# Patient Record
Sex: Male | Born: 1980 | Race: White | Hispanic: No | Marital: Single | Smoking: Current every day smoker
Health system: Southern US, Community
[De-identification: ages and names within clinical notes are randomized; demographics above are authoritative.]

## PROBLEM LIST (undated history)

## (undated) DIAGNOSIS — G43909 Migraine, unspecified, not intractable, without status migrainosus: Secondary | ICD-10-CM

## (undated) DIAGNOSIS — G9389 Other specified disorders of brain: Secondary | ICD-10-CM

## (undated) HISTORY — PX: PB APPENDECTOMY: 44950

## (undated) HISTORY — PX: APPENDECTOMY: SHX54

---

## 2005-01-26 ENCOUNTER — Emergency Department: Payer: Self-pay | Admitting: Emergency Medicine

## 2007-06-02 ENCOUNTER — Emergency Department: Payer: Self-pay | Admitting: Emergency Medicine

## 2010-11-18 ENCOUNTER — Emergency Department: Payer: Self-pay | Admitting: Emergency Medicine

## 2012-04-14 ENCOUNTER — Emergency Department: Payer: Self-pay | Admitting: *Deleted

## 2012-04-14 LAB — ETHANOL: Ethanol: <3 mg/dL

## 2012-04-14 LAB — URINALYSIS, COMPLETE
Glucose,UR: NEGATIVE mg/dL (ref 0–75)
Nitrite: NEGATIVE
Protein: NEGATIVE

## 2012-04-14 LAB — DRUG SCREEN, URINE
Amphetamines, Ur Screen: NEGATIVE (ref ?–1000)
Benzodiazepine, Ur Scrn: NEGATIVE (ref ?–200)
Methadone, Ur Screen: NEGATIVE (ref ?–300)
Tricyclic, Ur Screen: NEGATIVE (ref ?–1000)

## 2012-04-14 LAB — COMPREHENSIVE METABOLIC PANEL
Anion Gap: 9 (ref 7–16)
BUN: 14 mg/dL (ref 7–18)
Calcium, Total: 8.9 mg/dL (ref 8.5–10.1)
Chloride: 103 mmol/L (ref 98–107)
EGFR (African American): >60
Glucose: 66 mg/dL (ref 65–99)
SGOT(AST): 27 U/L (ref 15–37)
SGPT (ALT): 60 U/L (ref 12–78)
Total Protein: 7.4 g/dL (ref 6.4–8.2)

## 2012-04-14 LAB — CBC
HGB: 13.3 g/dL (ref 13.0–18.0)
RBC: 4.23 10*6/uL — ABNORMAL LOW (ref 4.40–5.90)

## 2012-09-16 ENCOUNTER — Emergency Department (HOSPITAL_COMMUNITY)
Admission: EM | Admit: 2012-09-16 | Discharge: 2012-09-16 | Disposition: A | Discharge status: Home / Self Care | Payer: Self-pay | Attending: Emergency Medicine | Admitting: Emergency Medicine

## 2012-09-16 ENCOUNTER — Encounter (HOSPITAL_COMMUNITY): Payer: Self-pay | Admitting: *Deleted

## 2012-09-16 DIAGNOSIS — R42 Dizziness and giddiness: Secondary | ICD-10-CM | POA: Insufficient documentation

## 2012-09-16 DIAGNOSIS — F17200 Nicotine dependence, unspecified, uncomplicated: Secondary | ICD-10-CM | POA: Insufficient documentation

## 2012-09-16 DIAGNOSIS — R51 Headache: Secondary | ICD-10-CM | POA: Insufficient documentation

## 2012-09-16 MED ORDER — CEPHALEXIN 500 MG PO CAPS: 500.0000 mg | ORAL_CAPSULE | Freq: Four times a day (QID) | ORAL | Status: DC

## 2012-09-16 MED ORDER — KETOROLAC TROMETHAMINE 30 MG/ML IJ SOLN
30.0000 mg | Freq: Once | INTRAMUSCULAR | Status: AC
  Administered 2012-09-16: 30 mg via INTRAVENOUS
  Filled 2012-09-16: qty 1

## 2012-09-16 MED ORDER — SODIUM CHLORIDE 0.9 % IV BOLUS (SEPSIS)
1000.0000 mL | Freq: Once | INTRAVENOUS | Status: AC
  Administered 2012-09-16: 1000 mL via INTRAVENOUS

## 2012-09-16 MED ORDER — DEXAMETHASONE SODIUM PHOSPHATE 10 MG/ML IJ SOLN
10.0000 mg | Freq: Once | INTRAMUSCULAR | Status: AC
  Administered 2012-09-16: 10 mg via INTRAVENOUS
  Filled 2012-09-16: qty 1

## 2012-09-16 MED ORDER — DIPHENHYDRAMINE HCL 50 MG/ML IJ SOLN
25.0000 mg | Freq: Once | INTRAMUSCULAR | Status: AC
  Administered 2012-09-16: 25 mg via INTRAVENOUS
  Filled 2012-09-16: qty 1

## 2012-09-16 MED ORDER — FENTANYL CITRATE 0.05 MG/ML IJ SOLN
100.0000 ug | Freq: Once | INTRAMUSCULAR | Status: AC
  Administered 2012-09-16: 100 ug via INTRAVENOUS
  Filled 2012-09-16: qty 2

## 2012-09-16 MED ORDER — HYDROCODONE-ACETAMINOPHEN 5-325 MG PO TABS: 1.0000 | ORAL_TABLET | ORAL | Status: DC | PRN

## 2012-09-16 NOTE — ED Provider Notes (Signed)
Medical screening examination/treatment/procedure(s) were performed by non-physician practitioner and as supervising physician I was immediately available for consultation/collaboration.   Gwyneth Sprout, MD 09/16/12 2340

## 2012-09-16 NOTE — ED Notes (Signed)
Headache for one week woth dizziness intermittently and his head feels very heavy.  He is also c/o a knot on his head

## 2012-09-16 NOTE — ED Provider Notes (Signed)
History     CSN: [id number]  Arrival date & time 09/16/12  1536   First MD Initiated Contact with Patient 09/16/12 1905      Chief Complaint  Patient presents with  . Headache    (Consider location/radiation/quality/duration/timing/severity/associated sxs/prior treatment) HPI  Patient presents to the ED with complaints of headache for 1 week. It has come and gone and he has had intermittent dizziness with it. He describes his head feeling heavy and feels as though the back of his scalp is swollen but denies any injury, loc, vomiting, neck pain, fevers, weakness, loss of balance. Has tried OTC medications with no relief. Pt does not appear toxic and is A&O x 3.  History reviewed. No pertinent past medical history.  History reviewed. No pertinent past surgical history.  No family history on file.  History  Substance Use Topics  . Smoking status: Current Every Day Smoker  . Smokeless tobacco: Not on file  . Alcohol Use: Yes      Review of Systems  Neurological: Positive for dizziness and headaches.  All other systems reviewed and are negative.    Allergies  Review of patient's allergies indicates no known allergies.  Home Medications   Current Outpatient Rx  Name  Route  Sig  Dispense  Refill  . IBUPROFEN 200 MG PO TABS   Oral   Take 800 mg by mouth every 6 (six) hours as needed. For pain.           BP 125/97  Pulse 64  Temp 98.3 F (36.8 C) (Oral)  Resp 16  SpO2 100%  Physical Exam  Nursing note and vitals reviewed. Constitutional: He appears well-developed and well-nourished. No distress.  HENT:  Head: Normocephalic and atraumatic.  Eyes: Pupils are equal, round, and reactive to light.  Neck: Normal range of motion. Neck supple. No spinous process tenderness and no muscular tenderness present.  Cardiovascular: Normal rate and regular rhythm.   Pulmonary/Chest: Effort normal.  Abdominal: Soft.  Lymphadenopathy:       Head (right side): Occipital  (lymph node is swollen but without any signs of infection. It is mobile and non tender) adenopathy present.  Neurological: He is alert. He has normal strength. No cranial nerve deficit or sensory deficit. He displays a negative Romberg sign.  Skin: Skin is warm and dry.    ED Course  Procedures (including critical care time)  Labs Reviewed - No data to display No results found.   No diagnosis found.  Dx. headache  MDM  Decadron, Toradol and Benadryl given- helped the headache a little bit. Fentanyl given and patients headache resolved.   No concerning findings with headache. The patient at discharge asks for referral for Hep C that he thinks someone told him that he has. Will refer to GI for follow-up with this. Pt advised to bring paper work confirming Hep C to appointment.  Discussed if CT head was needed with patient. We have decided against it. Pt understands symptoms that warrant return back to ED.  Pt has been advised of the symptoms that warrant their return to the ED. Patient has voiced understanding and has agreed to follow-up with the PCP or specialist.        Dorthula Matas, PA 09/16/12 2136

## 2012-09-17 ENCOUNTER — Emergency Department (HOSPITAL_COMMUNITY): Payer: Self-pay

## 2012-09-17 ENCOUNTER — Encounter (HOSPITAL_COMMUNITY): Payer: Self-pay

## 2012-09-17 ENCOUNTER — Emergency Department (HOSPITAL_COMMUNITY)
Admission: EM | Admit: 2012-09-17 | Discharge: 2012-09-17 | Disposition: A | Discharge status: Home / Self Care | Payer: Self-pay | Attending: Emergency Medicine | Admitting: Emergency Medicine

## 2012-09-17 DIAGNOSIS — F17200 Nicotine dependence, unspecified, uncomplicated: Secondary | ICD-10-CM | POA: Insufficient documentation

## 2012-09-17 DIAGNOSIS — H539 Unspecified visual disturbance: Secondary | ICD-10-CM | POA: Insufficient documentation

## 2012-09-17 DIAGNOSIS — D1779 Benign lipomatous neoplasm of other sites: Secondary | ICD-10-CM | POA: Insufficient documentation

## 2012-09-17 DIAGNOSIS — R42 Dizziness and giddiness: Secondary | ICD-10-CM | POA: Insufficient documentation

## 2012-09-17 DIAGNOSIS — R51 Headache: Secondary | ICD-10-CM | POA: Insufficient documentation

## 2012-09-17 DIAGNOSIS — R209 Unspecified disturbances of skin sensation: Secondary | ICD-10-CM | POA: Insufficient documentation

## 2012-09-17 MED ORDER — OXYCODONE-ACETAMINOPHEN 5-325 MG PO TABS: 1.0000 | ORAL_TABLET | Freq: Four times a day (QID) | ORAL | Status: DC | PRN

## 2012-09-17 MED ORDER — KETOROLAC TROMETHAMINE 60 MG/2ML IM SOLN: 60.0000 mg | Freq: Once | INTRAMUSCULAR | Status: DC

## 2012-09-17 MED ORDER — OXYCODONE-ACETAMINOPHEN 5-325 MG PO TABS
2.0000 | ORAL_TABLET | Freq: Once | ORAL | Status: AC
  Administered 2012-09-17: 2 via ORAL
  Filled 2012-09-17: qty 2

## 2012-09-17 NOTE — ED Provider Notes (Signed)
History   This chart was scribed for non-physician practitioner working with Benjamin Razor, MD by Smitty Pluck. This patient was seen in room TR11C/TR11C and the patient's care was started at 3:44 PM.   CSN: [id number]  Arrival date & time 09/17/12  1353   None     Chief Complaint  Patient presents with  . Headache    (Consider location/radiation/quality/duration/timing/severity/associated sxs/prior treatment) Patient is a 32 y.o. male presenting with headaches. The history is provided by the patient and the spouse. No language interpreter was used.  Headache  This is a new problem. The current episode started more than 2 days ago. The problem occurs constantly. The problem has not changed since onset.The headache is associated with nothing. The pain is moderate. The pain does not radiate. Pertinent negatives include no fever, no shortness of breath, no nausea and no vomiting.   Benjamin Lowery is a 32 y.o. male who presents to the Emergency Department complaining of constant, moderate headache onset 5 days ago. He mentions that he had dizziness and blurred vision today. Pt states today he had syncope. He states that he landed on his bed and had LOC for 15-20 seconds. The syncope was witnessed by his girlfriend. Pt was seen in ED for same symptoms 1 day ago and states he was told that he had adenopathy. He has taken hydrocodone without relief. He reports numbness in bilateral hands. He denies nausea, vomiting, weakness and any other pain.   History reviewed. No pertinent past medical history.  History reviewed. No pertinent past surgical history.  No family history on file.  History  Substance Use Topics  . Smoking status: Current Every Day Smoker  . Smokeless tobacco: Not on file  . Alcohol Use: Yes      Review of Systems  Constitutional: Negative for fever and chills.  Eyes: Positive for visual disturbance.  Respiratory: Negative for shortness of breath.     Gastrointestinal: Negative for nausea and vomiting.  Neurological: Positive for dizziness, numbness and headaches. Negative for weakness.  All other systems reviewed and are negative.    Allergies  Review of patient's allergies indicates no known allergies.  Home Medications   Current Outpatient Rx  Name  Route  Sig  Dispense  Refill  . CEPHALEXIN 500 MG PO CAPS   Oral   Take 1 capsule (500 mg total) by mouth 4 (four) times daily.   20 capsule   0   . HYDROCODONE-ACETAMINOPHEN 5-325 MG PO TABS   Oral   Take 1 tablet by mouth every 4 (four) hours as needed for pain.   6 tablet   0   . IBUPROFEN 200 MG PO TABS   Oral   Take 800 mg by mouth every 6 (six) hours as needed. For pain.           BP 133/96  Pulse 94  Temp 98.1 F (36.7 C) (Oral)  Resp 18  Ht 5\' 1"  (1.549 m)  Wt 130 lb (58.968 kg)  BMI 24.56 kg/m2  SpO2 97%  Physical Exam  Nursing note and vitals reviewed. Constitutional: He is oriented to person, place, and time. He appears well-developed and well-nourished. No distress.  HENT:  Head: Normocephalic and atraumatic.  Eyes: Conjunctivae normal and EOM are normal. Pupils are equal, round, and reactive to light.       No nystagmus  No photophobia    Neck: Neck supple. No tracheal deviation present.  Cardiovascular: Normal rate, regular rhythm and normal  heart sounds.   Pulmonary/Chest: Effort normal and breath sounds normal. No respiratory distress.  Musculoskeletal: Normal range of motion.  Neurological: He is alert and oriented to person, place, and time. No cranial nerve deficit.  Skin: Skin is warm and dry.  Psychiatric: He has a normal mood and affect. His behavior is normal.    ED Course  Procedures (including critical care time) DIAGNOSTIC STUDIES: Oxygen Saturation is 97% on room air, adequate by my interpretation.    COORDINATION OF CARE: 3:47 PM Discussed ED treatment with pt and pt agrees.     Labs Reviewed - No data to display Ct  Head Wo Contrast  09/17/2012  *RADIOLOGY REPORT*  Clinical Data: 32 year old male with headache, blurred vision, dizziness, syncopal episode.  CT HEAD WITHOUT CONTRAST  Technique:  Contiguous axial images were obtained from the base of the skull through the vertex without contrast.  Comparison: None.  Findings: Visualized paranasal sinuses and mastoids are clear. Visualized orbit soft tissues are within normal limits.  Visualized scalp soft tissues are within normal limits.  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  No other abnormal no other fat density identified within the brain.  No associated mass effect.  No midline shift or evidence of any other intracranial mass lesion. No ventriculomegaly. Normal cerebral volume.  Gray-white matter differentiation is within normal limits throughout the brain.  No evidence of cortically based acute infarction identified.  No acute intracranial hemorrhage identified.  No suspicious intracranial vascular hyperdensity.  IMPRESSION: Negative noncontrast CT appearance of the brain.  Incidental small congenital intracranial lipoma.   Original Report Authenticated By: Erskine Speed, M.D.      No diagnosis found.  Headache.   Radiology results reviewed, discussed with Dr. Juleen China, shared with patient.  Patient provided prescription for analgesic and referred to neurosurgery for follow-up.  MDM        I personally performed the services described in this documentation, which was scribed in my presence. The recorded information has been reviewed and is accurate.    Jimmye Norman, NP 09/17/12 [phone]

## 2012-09-17 NOTE — ED Notes (Addendum)
Pt. Was here yesterday for a headache and he is not feeling any better.  Pain medication is not helping, Blurred vision, dizziness . Pt. Also reports getting out of bed last night and passing out onto the bed for approximately 15 - 20 seconds.  Witnessed by girlfriend.

## 2012-09-17 NOTE — ED Notes (Signed)
Pt discharged.Vital signs stable and GCS 15 

## 2012-09-20 NOTE — ED Provider Notes (Signed)
Medical screening examination/treatment/procedure(s) were performed by non-physician practitioner and as supervising physician I was immediately available for consultation/collaboration.  Jase Himmelberger, MD 09/20/12 0741 

## 2012-10-03 ENCOUNTER — Ambulatory Visit: Payer: Self-pay | Admitting: Neurology

## 2014-05-29 ENCOUNTER — Emergency Department: Payer: Self-pay | Admitting: Emergency Medicine

## 2014-07-22 ENCOUNTER — Emergency Department: Payer: Self-pay | Admitting: Emergency Medicine

## 2016-06-03 ENCOUNTER — Encounter: Payer: Self-pay | Admitting: Emergency Medicine

## 2016-06-03 ENCOUNTER — Emergency Department
Admission: EM | Admit: 2016-06-03 | Discharge: 2016-06-03 | Disposition: A | Discharge status: Home / Self Care | Payer: Self-pay | Attending: Emergency Medicine | Admitting: Emergency Medicine

## 2016-06-03 ENCOUNTER — Emergency Department: Payer: Self-pay

## 2016-06-03 DIAGNOSIS — R519 Headache, unspecified: Secondary | ICD-10-CM

## 2016-06-03 DIAGNOSIS — Z79899 Other long term (current) drug therapy: Secondary | ICD-10-CM | POA: Insufficient documentation

## 2016-06-03 DIAGNOSIS — F17210 Nicotine dependence, cigarettes, uncomplicated: Secondary | ICD-10-CM | POA: Insufficient documentation

## 2016-06-03 DIAGNOSIS — R51 Headache: Secondary | ICD-10-CM | POA: Insufficient documentation

## 2016-06-03 HISTORY — DX: Migraine, unspecified, not intractable, without status migrainosus: G43.909

## 2016-06-03 HISTORY — DX: Other specified disorders of brain: G93.89

## 2016-06-03 LAB — CBC
HCT: 39.5 % — ABNORMAL LOW (ref 40.0–52.0)
Hemoglobin: 13.6 g/dL (ref 13.0–18.0)
MCH: 31.1 pg (ref 26.0–34.0)
MCHC: 34.4 g/dL (ref 32.0–36.0)
MCV: 90.5 fL (ref 80.0–100.0)
Platelets: 240 10*3/uL (ref 150–440)
RBC: 4.36 MIL/uL — ABNORMAL LOW (ref 4.40–5.90)
RDW: 12.7 % (ref 11.5–14.5)
WBC: 11.5 10*3/uL — ABNORMAL HIGH (ref 3.8–10.6)

## 2016-06-03 LAB — BASIC METABOLIC PANEL
Anion gap: 6 (ref 5–15)
BUN: 8 mg/dL (ref 6–20)
CO2: 29 mmol/L (ref 22–32)
CREATININE: 0.94 mg/dL (ref 0.61–1.24)
Calcium: 8.9 mg/dL (ref 8.9–10.3)
Chloride: 103 mmol/L (ref 101–111)
Glucose, Bld: 101 mg/dL — ABNORMAL HIGH (ref 65–99)
Potassium: 3.8 mmol/L (ref 3.5–5.1)
SODIUM: 138 mmol/L (ref 135–145)

## 2016-06-03 MED ORDER — PROCHLORPERAZINE EDISYLATE 5 MG/ML IJ SOLN
10.0000 mg | Freq: Once | INTRAMUSCULAR | Status: DC
  Filled 2016-06-03: qty 2

## 2016-06-03 NOTE — ED Provider Notes (Signed)
[facility] Emergency Department Provider Note    ____________________________________________   I have reviewed the triage vital signs and the nursing notes.   HISTORY  Chief Complaint Migraine and Loss of Consciousness   History limited by: Not Limited   HPI Benjamin Lowery is a 35 y.o. male who presents to the emergency department today because of concerns for headache and a syncopal episode. Patient states that he has had a headache for roughly the past week. He has tried taking some Tylenol which gives him intermittent relief. He states he has a history of headaches. He also states that he has a history of an intracranial mass. He has not seen anybody for this or his headaches and a long time. The patient states that he has not had any occult he with vision or hearing. No nausea or vomiting. Patient states today with an he was with his girlfriend he apparently passed out. No chest pain shortness breath or palpitations.   Past Medical History:  Diagnosis Date  . Brain mass   . Migraine     There are no active problems to display for this patient.   Past Surgical History:  Procedure Laterality Date  . APPENDECTOMY      Prior to Admission medications   Medication Sig Start Date End Date Taking? Authorizing Provider  acetaminophen (TYLENOL) 500 MG tablet Take 1,000 mg by mouth every 6 (six) hours as needed for headache.   Yes Historical Provider, MD    Allergies Review of patient's allergies indicates no known allergies.  History reviewed. No pertinent family history.  Social History Social History  Substance Use Topics  . Smoking status: Current Every Day Smoker    Packs/day: 1.00    Years: 15.00    Types: Cigarettes  . Smokeless tobacco: Never Used  . Alcohol use Yes    Review of Systems  Constitutional: Negative for fever. Cardiovascular: Negative for chest pain. Respiratory: Negative for shortness of  breath. Gastrointestinal: Negative for abdominal pain, vomiting and diarrhea. Neurological: Positive for headaches  10-point ROS otherwise negative.  ____________________________________________   PHYSICAL EXAM:  VITAL SIGNS: ED Triage Vitals  Enc Vitals Group     BP 06/03/16 1927 (!) 140/96     Pulse Rate 06/03/16 1927 94     Resp 06/03/16 1927 15     Temp 06/03/16 1927 99.1 F (37.3 C)     Temp Source 06/03/16 1927 Oral     SpO2 06/03/16 1927 95 %     Weight 06/03/16 1928 140 lb (63.5 kg)     Height 06/03/16 1928 5\' 1"  (1.549 m)     Head Circumference --      Peak Flow --      Pain Score 06/03/16 1928 7   Constitutional: Alert and oriented. Well appearing and in no distress. Eyes: Conjunctivae are normal. Normal extraocular movements. ENT   Head: Normocephalic and atraumatic.   Nose: No congestion/rhinnorhea.   Mouth/Throat: Mucous membranes are moist.   Neck: No stridor. Hematological/Lymphatic/Immunilogical: No cervical lymphadenopathy. Cardiovascular: Normal rate, regular rhythm.  No murmurs, rubs, or gallops. Respiratory: Normal respiratory effort without tachypnea nor retractions. Breath sounds are clear and equal bilaterally. No wheezes/rales/rhonchi. Gastrointestinal: Soft and nontender. No distention.  Genitourinary: Deferred Musculoskeletal: Normal range of motion in all extremities. No lower extremity edema. Neurologic:  Normal speech and language. No gross focal neurologic deficits are appreciated.  Skin:  Skin is warm, dry and intact. No rash noted. Psychiatric: Mood and affect  are normal. Speech and behavior are normal. Patient exhibits appropriate insight and judgment.  ____________________________________________    LABS (pertinent positives/negatives)  Labs Reviewed  BASIC METABOLIC PANEL - Abnormal; Notable for the following:       Result Value   Glucose, Bld 101 (*)    All other components within normal limits  CBC - Abnormal;  Notable for the following:    WBC 11.5 (*)    RBC 4.36 (*)    HCT 39.5 (*)    All other components within normal limits  URINALYSIS COMPLETEWITH MICROSCOPIC ([facility] ONLY)  CBG MONITORING, ED     ____________________________________________   EKG  I, Nance Pear, attending physician, personally viewed and interpreted this EKG  EKG Time: 1944 Rate: 86 Rhythm: normal sinus rhythm Axis: normal Intervals: qtc 423 QRS: narrow ST changes: no st elevation Impression: normal ekg  ____________________________________________    RADIOLOGY  CT head   IMPRESSION:  No acute intracranial abnormality.    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 No evidence of  hydrocephalus or other compressive sequela.    ____________________________________________   PROCEDURES  Procedures  ____________________________________________   INITIAL IMPRESSION / ASSESSMENT AND PLAN / ED COURSE  Pertinent labs & imaging results that were available during my care of the patient were reviewed by me and considered in my medical decision making (see chart for details).  Patient here with headaches and syncopal episode. Patient with history of migraines. Did initially write for Compazine which patient refused. Then discussed with patient Fioricet which she also declined. He also declined steroids. He stated that he would discuss home and take Tylenol. He without any change in his lipoma. I discussed this with the patient. I also discussed with him that I would like him to follow up with neurology for further management of both the lipoma as well as headaches. Patient verbalizes understanding. ____________________________________________   FINAL CLINICAL IMPRESSION(S) / ED DIAGNOSES  Final diagnoses:  Bad headache     Note: This dictation was prepared with Dragon dictation. Any transcriptional errors that result from this process are unintentional    Nance Pear,  MD 06/03/16 2102

## 2016-06-03 NOTE — Discharge Instructions (Signed)
Please seek medical attention for any high fevers, chest pain, shortness of breath, change in behavior, persistent vomiting, bloody stool or any other new or concerning symptoms.  

## 2016-06-03 NOTE — ED Notes (Signed)
Pt refuses compazine. Pt states "i don't want any medicine that might make me sleepy."

## 2016-06-03 NOTE — ED Triage Notes (Signed)
Pt reports migraines for the last week. Stated that he had LOC today. No head injury. Hx of inoperable brain mass. Reports blurred vision.

## 2016-06-03 NOTE — ED Notes (Signed)
FIRST NURSE NOTE: Patient presents to the ED via EMS from home. Patient with complaints of a headache x 12 hours. Patient reports that he experienced a syncopal episode earlier today. Of note, PMH significant for a brain tumor.

## 2016-06-03 NOTE — ED Notes (Signed)
Patient proceeds from ED registration desk outside to smoke despite being asked to remain inside. Ambulatory without difficulties at this time.

## 2016-06-26 ENCOUNTER — Emergency Department
Admission: EM | Admit: 2016-06-26 | Discharge: 2016-06-26 | Discharge status: Against Medical Advice | Payer: Self-pay | Attending: Emergency Medicine | Admitting: Emergency Medicine

## 2016-06-26 ENCOUNTER — Emergency Department: Payer: Self-pay

## 2016-06-26 DIAGNOSIS — T401X1A Poisoning by heroin, accidental (unintentional), initial encounter: Secondary | ICD-10-CM | POA: Insufficient documentation

## 2016-06-26 DIAGNOSIS — F17210 Nicotine dependence, cigarettes, uncomplicated: Secondary | ICD-10-CM | POA: Insufficient documentation

## 2016-06-26 DIAGNOSIS — Z79899 Other long term (current) drug therapy: Secondary | ICD-10-CM | POA: Insufficient documentation

## 2016-06-26 DIAGNOSIS — F1410 Cocaine abuse, uncomplicated: Secondary | ICD-10-CM

## 2016-06-26 DIAGNOSIS — F1419 Cocaine abuse with unspecified cocaine-induced disorder: Secondary | ICD-10-CM | POA: Insufficient documentation

## 2016-06-26 LAB — COMPREHENSIVE METABOLIC PANEL
ALBUMIN: 3.7 g/dL (ref 3.5–5.0)
ALK PHOS: 40 U/L (ref 38–126)
ALT: 28 U/L (ref 17–63)
ANION GAP: 9 (ref 5–15)
AST: 29 U/L (ref 15–41)
BILIRUBIN TOTAL: 0.3 mg/dL (ref 0.3–1.2)
BUN: 8 mg/dL (ref 6–20)
CALCIUM: 8.5 mg/dL — AB (ref 8.9–10.3)
CO2: 27 mmol/L (ref 22–32)
CREATININE: 0.85 mg/dL (ref 0.61–1.24)
Chloride: 107 mmol/L (ref 101–111)
GFR calc Af Amer: >60 mL/min (ref >60)
GFR calc non Af Amer: >60 mL/min (ref >60)
GLUCOSE: 114 mg/dL — AB (ref 65–99)
Potassium: 3.9 mmol/L (ref 3.5–5.1)
SODIUM: 143 mmol/L (ref 135–145)
TOTAL PROTEIN: 6.4 g/dL — AB (ref 6.5–8.1)

## 2016-06-26 LAB — CBC
HEMATOCRIT: 42 % (ref 40.0–52.0)
HEMOGLOBIN: 14.5 g/dL (ref 13.0–18.0)
MCH: 31.6 pg (ref 26.0–34.0)
MCHC: 34.6 g/dL (ref 32.0–36.0)
MCV: 91.4 fL (ref 80.0–100.0)
Platelets: 232 10*3/uL (ref 150–440)
RBC: 4.59 MIL/uL (ref 4.40–5.90)
RDW: 13.6 % (ref 11.5–14.5)
WBC: 10.6 10*3/uL (ref 3.8–10.6)

## 2016-06-26 LAB — ETHANOL: Alcohol, Ethyl (B): 56 mg/dL — ABNORMAL HIGH (ref <5)

## 2016-06-26 LAB — URINE DRUG SCREEN, QUALITATIVE (ARMC ONLY)
Amphetamines, Ur Screen: NOT DETECTED
BARBITURATES, UR SCREEN: NOT DETECTED
BENZODIAZEPINE, UR SCRN: NOT DETECTED
COCAINE METABOLITE, UR ~~LOC~~: POSITIVE — AB
Cannabinoid 50 Ng, Ur ~~LOC~~: POSITIVE — AB
MDMA (Ecstasy)Ur Screen: NOT DETECTED
METHADONE SCREEN, URINE: NOT DETECTED
OPIATE, UR SCREEN: NOT DETECTED
PHENCYCLIDINE (PCP) UR S: NOT DETECTED
Tricyclic, Ur Screen: NOT DETECTED

## 2016-06-26 LAB — SALICYLATE LEVEL: Salicylate Lvl: <7 mg/dL (ref 2.8–30.0)

## 2016-06-26 LAB — TROPONIN I: Troponin I: <0.03 ng/mL (ref <0.03)

## 2016-06-26 LAB — ACETAMINOPHEN LEVEL

## 2016-06-26 MED ORDER — SODIUM CHLORIDE 0.9 % IV BOLUS (SEPSIS)
1000.0000 mL | Freq: Once | INTRAVENOUS | Status: AC
  Administered 2016-06-26: 1000 mL via INTRAVENOUS

## 2016-06-26 NOTE — ED Notes (Signed)
Pt irritable, agitated, stating he wants his D/C papers. Informed again that Dr. Beather Arbour wants to watch him one more hour. Pt stated he has to go home and sleep before going to work. Informed he could nap here. Pt still wants to leave and wants IV out now.   Dr. Beather Arbour notified. Pt leaving AMA.

## 2016-06-26 NOTE — ED Provider Notes (Signed)
[facility] Emergency Department Provider Note   ____________________________________________   First MD Initiated Contact with Patient 06/26/16 [phone]     (approximate)  I have reviewed the triage vital signs and the nursing notes.   HISTORY  Chief Complaint Drug Overdose    HPI Benjamin Lowery is a 35 y.o. male brought to the ED from home via EMS with the chief complaint of unresponsive secondary to heroin overdose. Patient admits to heroin use tonight. States he got out of his car and does not recall further events. Girlfriend found him unresponsive in the yard. Patient was given 2 mg of intranasal Narcan per EMS with good effect. Girlfriend thinks patient had only been unresponsive for approximately 10 minutes before she found him. He was initially hypoxic per EMS, requiring oxygen which was weaned by the time he arrived to the emergency department. Denies recent fever, chills, chest pain, shortness of breath, abdominal pain, nausea, vomiting, diarrhea. Denies recent travel or trauma. Nothing made his symptoms worse. Narcan reversed his symptoms.Denies SI/HI/AH/VH.   Past Medical History:  Diagnosis Date  . Brain mass   . Migraine     There are no active problems to display for this patient.   Past Surgical History:  Procedure Laterality Date  . APPENDECTOMY      Prior to Admission medications   Medication Sig Start Date End Date Taking? Authorizing Provider  acetaminophen (TYLENOL) 500 MG tablet Take 1,000 mg by mouth every 6 (six) hours as needed for headache.   Yes Historical Provider, MD    Allergies Review of patient's allergies indicates no known allergies.  History reviewed. No pertinent family history.  Social History Social History  Substance Use Topics  . Smoking status: Current Every Day Smoker    Packs/day: 1.00    Years: 15.00    Types: Cigarettes  . Smokeless tobacco: Never Used  . Alcohol use Yes    Review of  Systems  Constitutional: Positive for unresponsiveness. No fever/chills. Eyes: No visual changes. ENT: No sore throat. Cardiovascular: Denies chest pain. Respiratory: Denies shortness of breath. Gastrointestinal: No abdominal pain.  No nausea, no vomiting.  No diarrhea.  No constipation. Genitourinary: Negative for dysuria. Musculoskeletal: Negative for back pain. Skin: Negative for rash. Neurological: Negative for headaches, focal weakness or numbness.  10-point ROS otherwise negative.  ____________________________________________   PHYSICAL EXAM:  VITAL SIGNS: ED Triage Vitals [06/26/16 0040]  Enc Vitals Group     BP (!) 148/98     Pulse Rate 97     Resp 19     Temp 98 F (36.7 C)     Temp Source Oral     SpO2 97 %     Weight      Height      Head Circumference      Peak Flow      Pain Score 8     Pain Loc      Pain Edu?      Excl. in [place]?      Constitutional: Alert and oriented. Well appearing and in no acute distress. Eyes: Conjunctivae are normal. PERRL. EOMI. Head: Atraumatic. Nose: No congestion/rhinnorhea. Mouth/Throat: Mucous membranes are moist.  Oropharynx non-erythematous. Neck: No stridor.  No cervical spine tenderness to palpation. Cardiovascular: Normal rate, regular rhythm. Grossly normal heart sounds.  Good peripheral circulation. Respiratory: Normal respiratory effort.  No retractions. Lungs CTAB. Gastrointestinal: Soft and nontender. No distention. No abdominal bruits. No CVA tenderness. Musculoskeletal: No lower extremity tenderness  nor edema.  No joint effusions. Neurologic:  Normal speech and language. No gross focal neurologic deficits are appreciated. No gait instability. Ambulates to commode with steady gait. Skin:  Skin is warm, dry and intact. No rash noted. Psychiatric: Mood and affect are normal. Speech and behavior are normal.  ____________________________________________   LABS (all labs ordered are listed, but only abnormal  results are displayed)  Labs Reviewed  COMPREHENSIVE METABOLIC PANEL - Abnormal; Notable for the following:       Result Value   Glucose, Bld 114 (*)    Calcium 8.5 (*)    Total Protein 6.4 (*)    All other components within normal limits  ETHANOL - Abnormal; Notable for the following:    Alcohol, Ethyl (B) 56 (*)    All other components within normal limits  ACETAMINOPHEN LEVEL - Abnormal; Notable for the following:    Acetaminophen (Tylenol), Serum <10 (*)    All other components within normal limits  URINE DRUG SCREEN, QUALITATIVE ([facility] ONLY) - Abnormal; Notable for the following:    Cocaine Metabolite,Ur [place] POSITIVE (*)    Cannabinoid 50 Ng, Ur [place] POSITIVE (*)    All other components within normal limits  SALICYLATE LEVEL  CBC  TROPONIN I   ____________________________________________  EKG  ED ECG REPORT I, Fedor Kazmierski J, the attending physician, personally viewed and interpreted this ECG.   Date: 06/26/2016  EKG Time: 0033  Rate: 94  Rhythm: normal EKG, normal sinus rhythm  Axis: Normal  Intervals:none  ST&T Change: Nonspecific  ____________________________________________  RADIOLOGY  Portable chest x-ray (viewed by me, interpreted per Dr. Radene Knee): No acute cardiopulmonary process seen. ____________________________________________   PROCEDURES  Procedure(s) performed: None  Procedures  Critical Care performed: No  ____________________________________________   INITIAL IMPRESSION / ASSESSMENT AND PLAN / ED COURSE  Pertinent labs & imaging results that were available during my care of the patient were reviewed by me and considered in my medical decision making (see chart for details).  35 year old male status post heroin overdose who is now awake, alert and in no acute distress. Will obtain screening lab work, chest x-ray to evaluate for aspiration, and observe in the ED for several hours.  Clinical Course  Comment By Time  Patient sipping water in  no acute distress. Eager for discharge. Explained he requires a longer period of observation. Paulette Blanch, MD 10/30 [phone]  Patient and girlfriend left AMA while I was seeing other patients. They did not wish to wait for the 3 hour period of observation. He was noted to ambulate down the hall with steady gait. Paulette Blanch, MD 10/30 0229     ____________________________________________   FINAL CLINICAL IMPRESSION(S) / ED DIAGNOSES  Final diagnoses:  Accidental overdose of heroin, initial encounter  Cocaine abuse      NEW MEDICATIONS STARTED DURING THIS VISIT:  New Prescriptions   No medications on file     Note:  This document was prepared using Dragon voice recognition software and may include unintentional dictation errors.    Paulette Blanch, MD 06/26/16 [phone]

## 2016-06-26 NOTE — ED Notes (Signed)
Pt refusing to stay, wants IV out.  EDP notified.  Okay'd to sign out AMA.

## 2016-06-26 NOTE — ED Notes (Signed)
X-ray at bedside

## 2016-06-26 NOTE — ED Triage Notes (Signed)
Pt found unresponsive in front yard by girlfriend. BIB [facility]. Gave 2 intranasal narcan. Pt reports doing heroin tonight. Pt states he doesn't know if heroin came from a different dealer or if he did too much. Pt was 70's on RA, started on 4L  and oxygen came to 100%. Pt has already been weaned off oxygen. Also admits to ETOH tonight. Pt is alert and oriented upon arrival, ambulatory to toilet with steady gait to obtain urine sample.

## 2016-06-26 NOTE — ED Notes (Signed)
Pt stated he needed to urinate. Pt also stated he wanted to leave, that he had been here too long. Pt advised that Dr. Beather Arbour wants to watch pt in case narcan wears off. Pt girlfriend at bedside.

## 2018-04-05 IMAGING — CT CT HEAD W/O CM
3 series · 14 of 43 positions shown, 16 images · non-contrast
Comparison: MRI of the brain 10/03/2012

CLINICAL DATA: Headache with loss of consciousness and blurry
vision. Patient was diagnosed with brain tumor in 0081 which has not
been treated.

EXAM:
CT HEAD WITHOUT CONTRAST
TECHNIQUE: Contiguous axial images were obtained from the base of the skull
through the vertex without intravenous contrast.

[Series 2: head wo · axial · 0.47mm/px · z∈[-166,-61]mm · 8 of 26 slices shown, 10 images]
[im 3/26  brain]
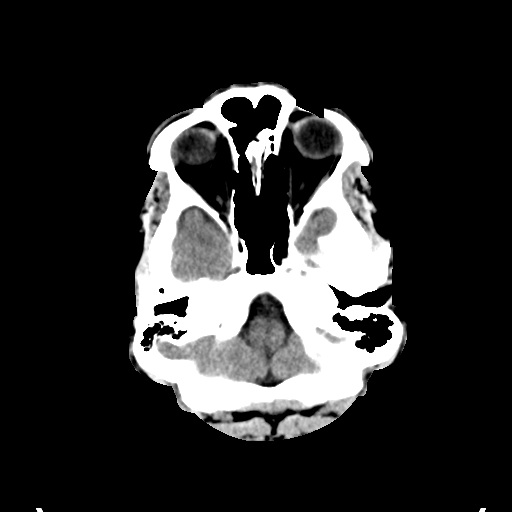
[im 3/26  bone]
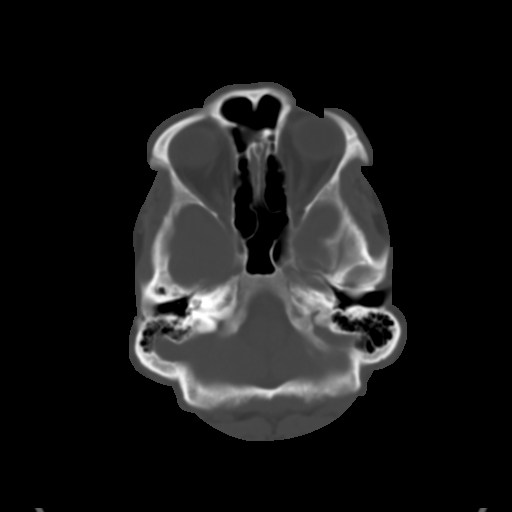
[im 6/26  brain]
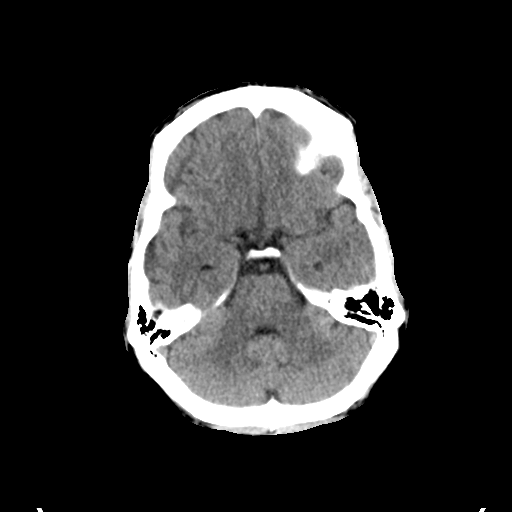
[im 9/26  brain]
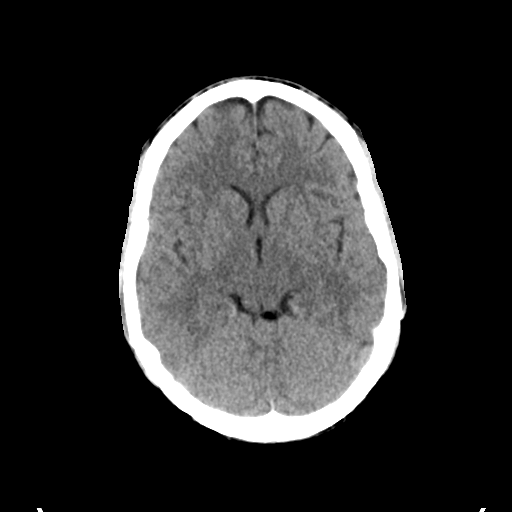
[im 12/26  brain]
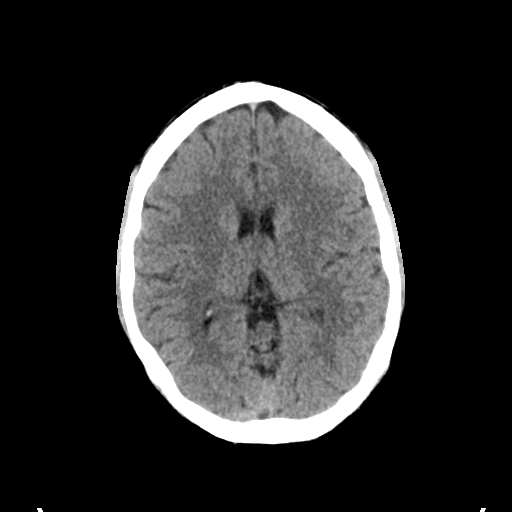
[im 15/26  brain]
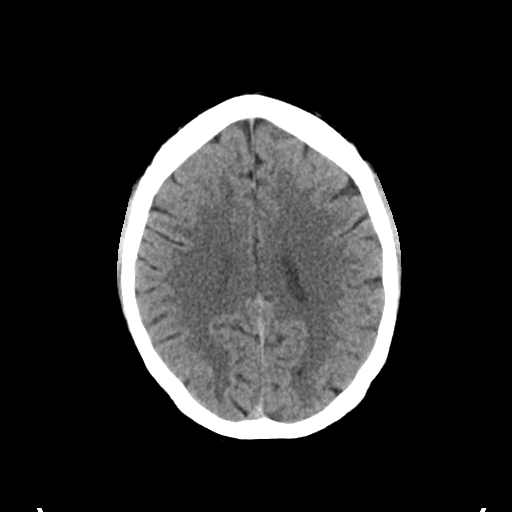
[im 15/26  bone]
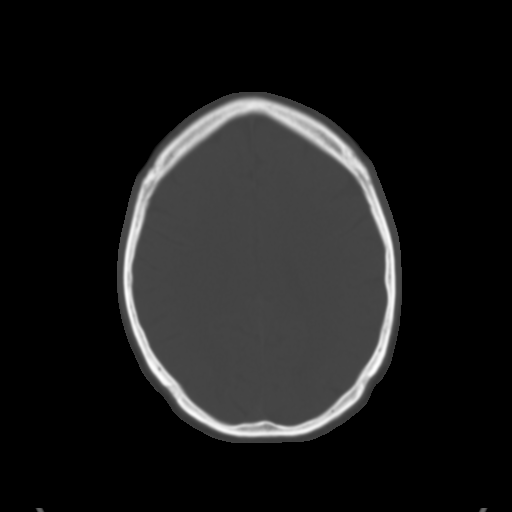
[im 18/26  brain]
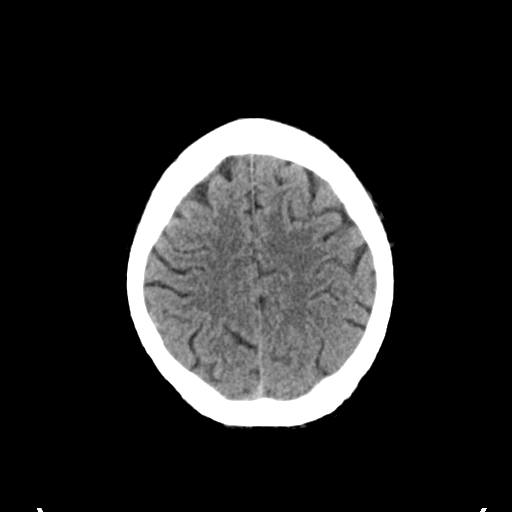
[im 21/26  brain]
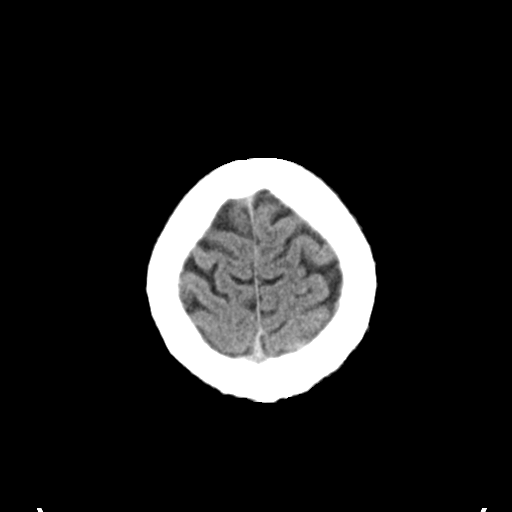
[im 24/26  brain]
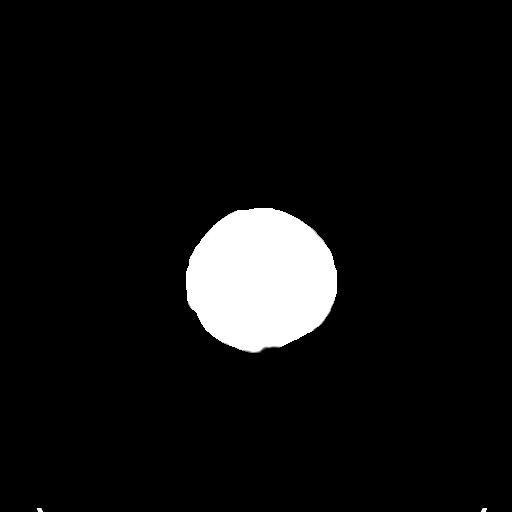

[Series 4: coronal soft tissue · coronal · 0.27mm/px · 3 of 56 slices shown]
[im 19/56  brain]
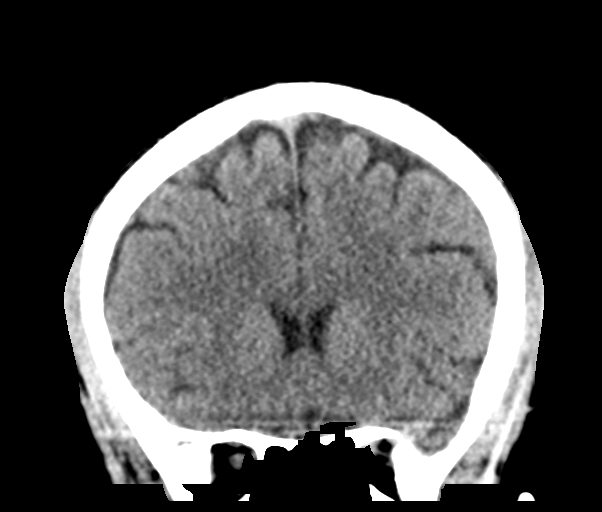
[im 25/56  brain]
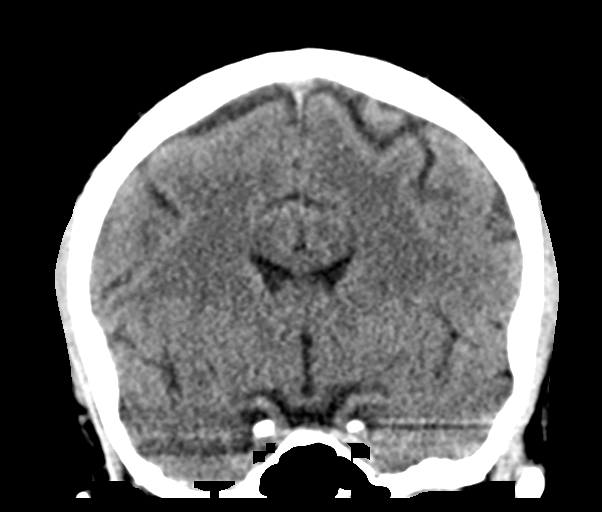
[im 31/56  brain]
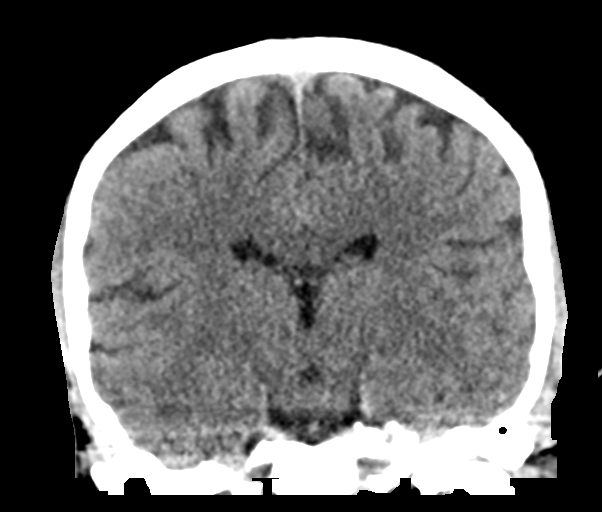

[Series 5: sagittal soft tissue · sagittal · 0.26mm/px · 3 of 46 slices shown]
[im 16/46  brain]
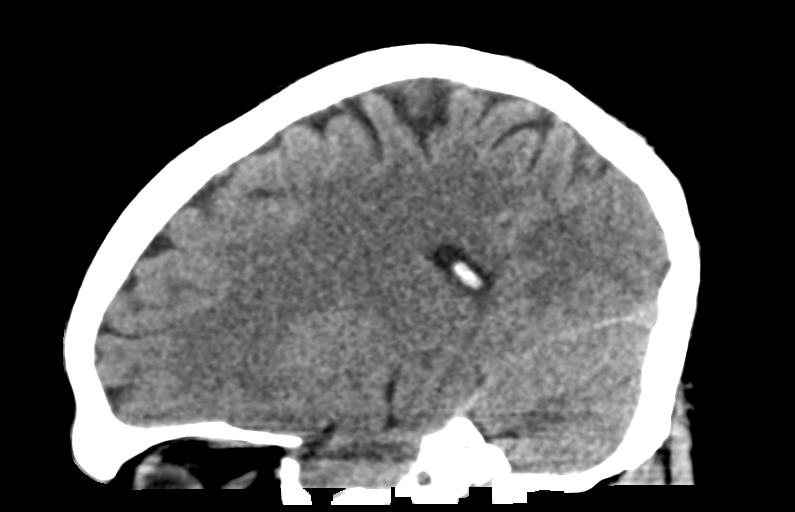
[im 23/46  brain]
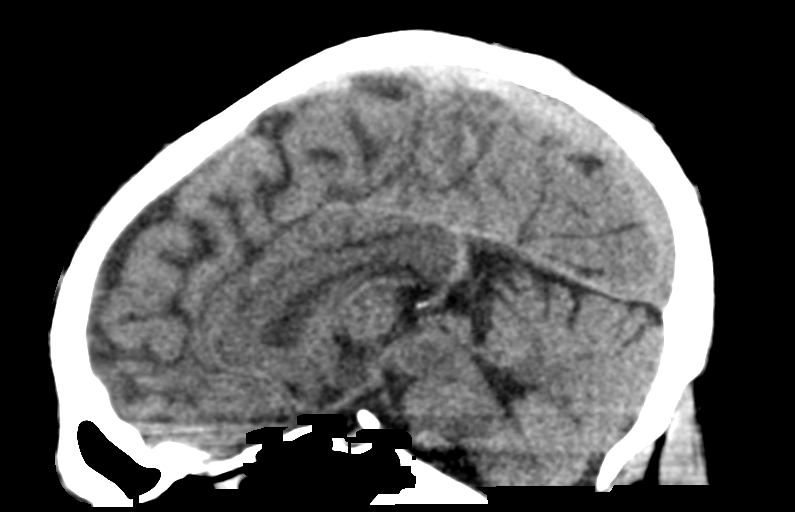
[im 31/46  brain]
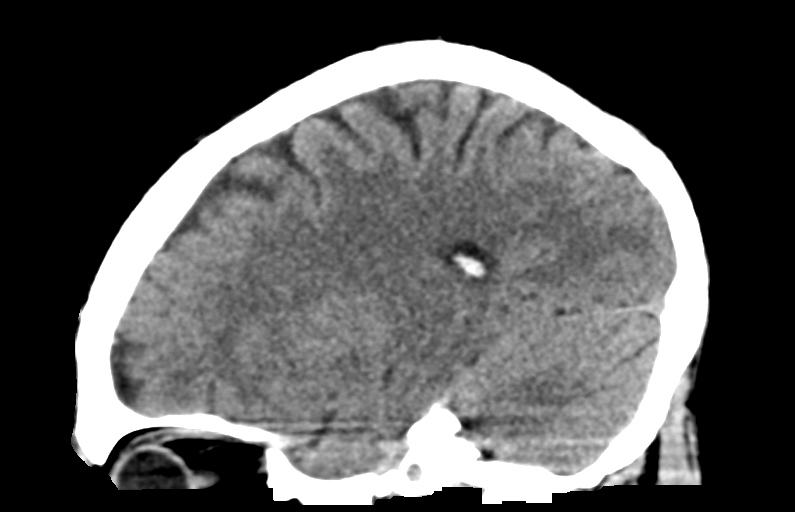

[14 of 43 positions shown; findings below may reference images not displayed]

FINDINGS: Brain: No evidence of acute infarction, hemorrhage, hydrocephalus,
extra-axial collection or new mass lesion/mass effect. 11 mm lipoma
in the quadrigeminal cistern is stable.

Vascular: No hyperdense vessel or unexpected calcification.

Skull: Normal. Negative for fracture or focal lesion.

Sinuses/Orbits: No acute finding.

Other: None.
IMPRESSION: No acute intracranial abnormality.

Stable small lipoma in the quadrigeminal cistern. No evidence of
hydrocephalus or other compressive sequela.

## 2019-06-19 ENCOUNTER — Emergency Department
Admission: EM | Admit: 2019-06-19 | Discharge: 2019-06-19 | Disposition: A | Discharge status: Home / Self Care | Payer: Self-pay | Attending: Emergency Medicine | Admitting: Emergency Medicine

## 2019-06-19 ENCOUNTER — Other Ambulatory Visit: Payer: Self-pay

## 2019-06-19 DIAGNOSIS — Z20828 Contact with and (suspected) exposure to other viral communicable diseases: Secondary | ICD-10-CM | POA: Insufficient documentation

## 2019-06-19 DIAGNOSIS — Z0279 Encounter for issue of other medical certificate: Secondary | ICD-10-CM | POA: Insufficient documentation

## 2019-06-19 DIAGNOSIS — F17210 Nicotine dependence, cigarettes, uncomplicated: Secondary | ICD-10-CM | POA: Insufficient documentation

## 2019-06-19 DIAGNOSIS — Z20822 Contact with and (suspected) exposure to covid-19: Secondary | ICD-10-CM

## 2019-06-19 DIAGNOSIS — R509 Fever, unspecified: Secondary | ICD-10-CM | POA: Insufficient documentation

## 2019-06-19 NOTE — ED Notes (Signed)
See triage note  States he did not feel well this am  Low grade temp at home this am with slight cough  States he called out of work  And was told he needed to be tested and cleared  Afebrile on arrival   Feels better

## 2019-06-19 NOTE — ED Triage Notes (Signed)
Pt reports he awoke today with mild cough and fever of 100.55F. he took a nap and when he awoke he felt fine. States he feels fine now but his work wants him tested for covid and a work note. Pt alert and oriented X4, cooperative, RR even and unlabored, color WNL. Pt in NAD.

## 2019-06-19 NOTE — ED Provider Notes (Signed)
[facility] Emergency Department Provider Note  ____________________________________________  Time seen: Approximately 5:45 PM  I have reviewed the triage vital signs and the nursing notes.   HISTORY  Chief Complaint Letter for School/Work    HPI Benjamin Lowery is a 38 y.o. male who presents the emergency department for a work note and COVID-19 swab.  Patient reports that yesterday he was at work, using a sander and felt like he had become "clogged up."  Patient reports that he was coughing somewhat this morning at work and his boss patient takes temperature and it was 100.3 F.  Patient reports that he feels fine at this time but his work made him come to be tested for COVID-19 and informed him that he cannot return until he had negative results.  Patient is currently asymptomatic with no complaints.         Past Medical History:  Diagnosis Date  . Brain mass   . Migraine     There are no active problems to display for this patient.   Past Surgical History:  Procedure Laterality Date  . APPENDECTOMY      Prior to Admission medications   Medication Sig Start Date End Date Taking? Authorizing Provider  acetaminophen (TYLENOL) 500 MG tablet Take 1,000 mg by mouth every 6 (six) hours as needed for headache.    [provider]    Allergies Patient has no known allergies.  No family history on file.  Social History Social History   Tobacco Use  . Smoking status: Current Every Day Smoker    Packs/day: 1.00    Years: 15.00    Pack years: 15.00    Types: Cigarettes  . Smokeless tobacco: Never Used  Substance Use Topics  . Alcohol use: Yes  . Drug use: Yes    Comment: heroin     Review of Systems  Constitutional: 100.3 F temperature earlier today.  None currently. Eyes: No visual changes. No discharge ENT: No upper respiratory complaints. Cardiovascular: no chest pain. Respiratory: no cough. No SOB. Gastrointestinal: No  abdominal pain.  No nausea, no vomiting. Musculoskeletal: Negative for musculoskeletal pain. Skin: Negative for rash, abrasions, lacerations, ecchymosis. Neurological: Negative for headaches, focal weakness or numbness. 10-point ROS otherwise negative.  ____________________________________________   PHYSICAL EXAM:  VITAL SIGNS: ED Triage Vitals [06/19/19 1732]  Enc Vitals Group     BP 138/84     Pulse Rate 85     Resp 16     Temp 98.2 F (36.8 C)     Temp Source Oral     SpO2 100 %     Weight 130 lb (59 kg)     Height 5\' 1"  (1.549 m)     Head Circumference      Peak Flow      Pain Score 0     Pain Loc      Pain Edu?      Excl. in [place]?      Constitutional: Alert and oriented. Well appearing and in no acute distress. Eyes: Conjunctivae are normal. PERRL. EOMI. Head: Atraumatic. ENT:      Ears:       Nose: No congestion/rhinnorhea.      Mouth/Throat: Mucous membranes are moist.  Neck: No stridor.   Hematological/Lymphatic/Immunilogical: No cervical lymphadenopathy. Cardiovascular: Normal rate, regular rhythm. Normal S1 and S2.  Good peripheral circulation. Respiratory: Normal respiratory effort without tachypnea or retractions. Lungs CTAB. Good air entry to the bases with no decreased or  absent breath sounds. Gastrointestinal: Bowel sounds 4 quadrants. Soft and nontender to palpation. No guarding or rigidity. No palpable masses. No distention. No CVA tenderness. Musculoskeletal: Full range of motion to all extremities. No gross deformities appreciated. Neurologic:  Normal speech and language. No gross focal neurologic deficits are appreciated.  Skin:  Skin is warm, dry and intact. No rash noted. Psychiatric: Mood and affect are normal. Speech and behavior are normal. Patient exhibits appropriate insight and judgement.   ____________________________________________   LABS (all labs ordered are listed, but only abnormal results are displayed)  Labs Reviewed  NOVEL  CORONAVIRUS, NAA (HOSP ORDER, SEND-OUT TO REF LAB; TAT 18-24 HRS)   ____________________________________________  EKG   ____________________________________________  RADIOLOGY   No results found.  ____________________________________________    PROCEDURES  Procedure(s) performed:    Procedures    Medications - No data to display   ____________________________________________   INITIAL IMPRESSION / ASSESSMENT AND PLAN / ED COURSE  Pertinent labs & imaging results that were available during my care of the patient were reviewed by me and considered in my medical decision making (see chart for details).  Review of the [place] CSRS was performed in accordance of the [place] prior to dispensing any controlled drugs.           Patient's diagnosis is consistent with encounter for COVID-19 swab, clearance work.  Patient presented to emergency department because he had a cough and low-grade fever earlier today.  Patient reports that he had been using a sander yesterday without a mask and felt like this was the result of his cough.  Patient states that he feels fine at this time with no symptoms.  He is here because work made him be tested prior to returning for work.  Patient will have a test performed at this time.  Results are still pending.  Follow-up primary care as needed..  Patient is given ED precautions to return to the ED for any worsening or new symptoms.     ____________________________________________  FINAL CLINICAL IMPRESSION(S) / ED DIAGNOSES  Final diagnoses:  Encounter for laboratory testing for COVID-19 virus      NEW MEDICATIONS STARTED DURING THIS VISIT:  ED Discharge Orders    None          This chart was dictated using voice recognition software/Dragon. Despite best efforts to proofread, errors can occur which can change the meaning. Any change was purely unintentional.    Darletta Moll, PA-C 06/19/19 1759    Nance Pear,  MD 06/19/19 [phone]

## 2019-06-21 LAB — NOVEL CORONAVIRUS, NAA (HOSP ORDER, SEND-OUT TO REF LAB; TAT 18-24 HRS): SARS-CoV-2, NAA: NOT DETECTED

## 2019-07-16 ENCOUNTER — Other Ambulatory Visit
Admission: RE | Admit: 2019-07-16 | Discharge: 2019-07-16 | Disposition: A | Discharge status: Home / Self Care | Payer: Medicaid Other | Attending: Family Medicine | Admitting: Family Medicine

## 2019-07-16 NOTE — ED Notes (Signed)
Pt identification verified (Name, DOB), written consent obtained. Procedure explained to patient. Forensic box opened in presence of patient. Tourniquet applied to right arm, right ac cleansed with betadine and allowed to dry. Site punctured with provided needle, two gray top tubes obtained. Tourniquet removed and sterile gauze applied to area. Pearline Cables tops secured and initialed in presence of patient, given to officers present and secured.

## 2020-09-18 ENCOUNTER — Emergency Department
Admission: EM | Admit: 2020-09-18 | Discharge: 2020-09-19 | Disposition: A | Discharge status: Home / Self Care | Payer: Medicaid (Managed Care) | Attending: Student in an Organized Health Care Education/Training Program | Admitting: Student in an Organized Health Care Education/Training Program

## 2020-09-18 DIAGNOSIS — F17200 Nicotine dependence, unspecified, uncomplicated: Secondary | ICD-10-CM | POA: Insufficient documentation

## 2020-09-18 DIAGNOSIS — L03211 Cellulitis of face: Secondary | ICD-10-CM | POA: Insufficient documentation

## 2020-09-18 DIAGNOSIS — B1920 Unspecified viral hepatitis C without hepatic coma: Secondary | ICD-10-CM | POA: Insufficient documentation

## 2020-09-18 DIAGNOSIS — D72829 Elevated white blood cell count, unspecified: Secondary | ICD-10-CM | POA: Insufficient documentation

## 2020-09-18 DIAGNOSIS — R220 Localized swelling, mass and lump, head: Secondary | ICD-10-CM

## 2020-09-18 DIAGNOSIS — N179 Acute kidney failure, unspecified: Secondary | ICD-10-CM | POA: Insufficient documentation

## 2020-09-18 DIAGNOSIS — K029 Dental caries, unspecified: Secondary | ICD-10-CM | POA: Insufficient documentation

## 2020-09-18 NOTE — ED Provider Notes (Signed)
CHIEF COMPLAINT  Lip Swelling (that started yesterday after bitting own lip. )      HISTORY OF PRESENT ILLNESS:   Steve Ayers is a 40 year old male h/o Hep Cwho presents with lip swelling. The patient has a history of poor dentition. He says that he bit his lip yesterday and has had increasing swelling and pain at the site. He feels pain now in the left mandible.     Location: lower lip  Radiation: none  Quality: pain/swelling   Severity: mild  Duration: constant  Timing: 1 day  Context: Reports be accidentally bit his lip one day ago. Reports swelling increasing over the day. Mild pain. Swelling is spreading to left mandible. He reports a history of poor dentition. No chest pain, shortness of breath, fevers, nausea, vomiting abdominal pain. Denies allergies.     REVIEW OF SYSTEMS:  Constitutional: - fever  Head: - headache  CV: - chest pain  Resp: - shortness of breath  GI: - vomiting    All other systems reviewed and negative except as noted above    PAST MEDICAL HISTORY:  Hepatitis C s/p treatment over 8 weeks and he is currently taking the medication     Reviewed    SURGICAL HISTORY:  Past Surgical History:   Procedure Laterality Date   . PB APPENDECTOMY        Reviewed    ALLERGIES:  No Known Allergies   Reviewed     CURRENT MEDICATIONS:   Please see nursing notes    FAMILY HISTORY:  See HPI for elements reviewed      SOCIAL HISTORY:  Tobacco: + 17 pack yr  Alcohol: -  Drug use: THC    VITAL SIGNS:  First Vitals [09/18/20 2315]   Temperature Heart Rate Respirations Blood pressure (BP) SpO2   99.2 F (37.3 C) 117 20 (!) 154/91 94 %       PHYSICAL EXAM:  General: Awake, Alert, appears to be in no apparent distress   Head: Normocephalic, atraumatic   Eyes: No scleral icterus, no conjunctival injection   ENT: Normal appearing ears externally, normal appearing nose externally, poor dentition with multiple dental caries visible in teeth and recession of the gums, edema noted to the left inferior lip extending  into the left mandible, nontender, nonerythematous, no warmth, poor dentition with multiple dental caries  Neck: Supple, no tracheal deviation   Respiratory: Normal effort, no audible stridor, CTAB  Cardiovascular: Tachycardic rate, regular rhythm, warm and well perfused        Abdomen: Soft and nontender   Back:No costovertebral angle tenderness  Skin: No jaundice, No rash   Extremities: No edema, No traumatic deformity   Neuro: Face symmetric, normal speech    MEDICAL DECISION MAKING:  Steve Ayers is a 40 year old male with hx hepatitis C who presents with lip swelling and mild pain for 1 day after reportedly biting his lip. Vitals notable for tachycardia and exam notable for left lower lip swelling spreading to left mandible. Differential diagnosis includes facial cellulitis, Ludwigs angina, other SSTI. Will obtain Labs and CT Scan . Tachcyardia improved to 105 after IV fluids and unasyn abx treatment.     CT Soft Tissue Neck With Contrast   Final Result      1.  Focal soft tissue swelling of the left lower lip containing foci of increased density suggestive of blood products. Additional fat stranding extending throughout the left premaxillary and premandibular subcutaneous soft tissues,  which may be posttraumatic versus infectious. No walled off fluid collection.      2.  Fat-containing subcentimeter lesion in the left tectum/quadrigeminal plate cistern suggestive of a lipoma. Dedicated MRI brain can be considered as clinically indicated.      END IMPRESSION      I have personally reviewed the images upon which this report is based and agree with the findings and conclusions expressed above.            I have reviewed the patient's labs which were notable for leukocytosis of 19.3 and Creatinine of 1.5. Patient treated with IV Fluids. I have reviewed the patient's imaging which showed possible hematoma vs infection without evidence of walled off fluid collection. Any specific prior record I have reviewed  has been summarized in my HPI.     I called the patient on Jan 23 at 6:58 p.m. I spoke with the patient regarding the need to have several of his teeth removed as the dental caries (cavities) are the most likely source of the infection. The patient says he will look into finding a free or low cost dentist or going to a dental school for treatment. He reports that his symptoms are much improved today and that he is taking the antibiotics. I also recommended that the patient get his creatinine re-checked and that he keep hydrating during this time that his body is fighting off the infection. I gave strict return precautions.    Workup Summary       Value Comment By Time      Labs and imaging reviewed and significant for leukocytosis of 19.3 with L shift, negative for abscess or bony abnormality, positive for AKI probably secondary to ongoing infection Lisette Grinder 01/23 0207            ICD-10-CM ICD-9-CM   1. Facial cellulitis  L03.211 682.0   2. Facial swelling  R22.0 784.2   3. Dental caries  K02.9 521.00   4. Acute kidney injury (CMS-HCC)  N17.9 584.9   5. Leukocytosis, unspecified type  D72.829 288.60       ATTENDING ATTESTATION:  The Resident/Fellow was physically present with the Medical Student during the performance of the history and examination, and they jointly formulated the medical decision-making.  I personally examined and validated the key and relevant portions of the history and exam with the patient and participated in the medical decision-making.  I reviewed and agree with the findings and plan as documented by the Medical Student.  My additions and revisions are included in the record as necessary.    Rulon Abide, MD          Rulon Abide, MD  09/19/20 [phone]

## 2020-09-19 ENCOUNTER — Emergency Department: Payer: Medicaid (Managed Care)

## 2020-09-19 LAB — BASIC METABOLIC PANEL, BLOOD
BUN: 38 mg/dL — ABNORMAL HIGH (ref 7–25)
CO2: 25 mmol/L (ref 21–31)
Calcium: 10.5 mg/dL — ABNORMAL HIGH (ref 8.6–10.3)
Chloride: 101 mmol/L (ref 98–107)
Creat: 1.5 mg/dL — ABNORMAL HIGH (ref 0.7–1.3)
Electrolyte Balance: 12 mmol/L (ref 2–12)
Glucose: 116 mg/dL — ABNORMAL HIGH (ref 70–115)
Potassium: 3.9 mmol/L (ref 3.5–5.1)
Sodium: 138 mmol/L (ref 136–145)
eGFR - high estimate: >60 (ref >59)
eGFR - low estimate: 53 — ABNORMAL LOW (ref >59)

## 2020-09-19 LAB — CBC WITH DIFF, BLOOD
ANC automated: 14.6 10*3/uL — ABNORMAL HIGH (ref 2.0–8.1)
Basophils %: 0.9 %
Basophils Absolute: 0.2 10*3/uL (ref 0.0–0.2)
Eosinophils %: 0.2 %
Eosinophils Absolute: 0 10*3/uL (ref 0.0–0.5)
Hematocrit: 40.2 % (ref 39.5–50.0)
Hgb: 13.8 G/DL (ref 13.5–16.9)
Lymphocytes %: 13.7 %
Lymphocytes Absolute: 2.6 10*3/uL (ref 0.9–3.3)
MCH: 30.7 PG (ref 27.0–33.5)
MCHC: 34.2 G/DL (ref 32.0–35.5)
MCV: 89.7 FL (ref 81.5–97.0)
MPV: 8.7 FL (ref 7.2–11.7)
Monocytes %: 9.6 %
Monocytes Absolute: 1.9 10*3/uL — ABNORMAL HIGH (ref 0.0–0.8)
Neutrophils % (A): 75.6 %
PLT Count: 236 10*3/uL (ref 150–400)
RBC: 4.48 10*6/uL (ref 4.38–5.62)
RDW-CV: 13.6 % (ref 11.6–14.4)
White Bld Cell Count: 19.3 10*3/uL — ABNORMAL HIGH (ref 4.0–10.5)

## 2020-09-19 MED ORDER — SODIUM CHLORIDE 0.9 % IJ SOLN (CUSTOM)
50.0000 mL | Freq: Once | INTRAMUSCULAR | Status: DC | PRN
Start: 2020-09-19 — End: 2020-09-19

## 2020-09-19 MED ORDER — IOHEXOL 350 MG/ML IV SOLN
100.0000 mL | Freq: Once | INTRAVENOUS | Status: AC
Start: 2020-09-19 — End: 2020-09-19
  Administered 2020-09-19 (×2): 100 mL via INTRAVENOUS

## 2020-09-19 MED ORDER — LACTATED RINGERS IV BOLUS (~~LOC~~)
1000.0000 mL | Freq: Once | INTRAVENOUS | Status: AC
Start: 2020-09-19 — End: 2020-09-19
  Administered 2020-09-19 (×2): 1000 mL via INTRAVENOUS

## 2020-09-19 MED ORDER — SODIUM CHLORIDE FLUSH 0.9 % IV SOLN
10.0000 mL | Freq: Once | INTRAVENOUS | Status: DC | PRN
Start: 2020-09-19 — End: 2020-09-19

## 2020-09-19 MED ORDER — AMOXICILLIN-POT CLAVULANATE 875-125 MG OR TABS
1.0000 | ORAL_TABLET | Freq: Two times a day (BID) | ORAL | 0 refills | Status: AC
Start: 2020-09-19 — End: 2020-09-29

## 2020-09-19 MED ORDER — SODIUM CHLORIDE 0.9 % IV SOLN
3000.0000 mg | Freq: Once | INTRAVENOUS | Status: AC
Start: 2020-09-19 — End: 2020-09-19
  Administered 2020-09-19 (×2): 3000 mg via INTRAVENOUS
  Filled 2020-09-19: qty 3000

## 2020-09-19 NOTE — Discharge Instructions (Addendum)
Thank you for allowing [place] to care for you today at [facility].     You have been diagnosed with lip swelling which may be due to infection, which means you need to take antibiotics as prescribed and follow up with your primary care doctor. Based on your workup here in the emergency department, you were deemed stable for discharge.       Instructions:    - Follow up with your regular doctor for a recheck and further management within 2 days.   - If you have received a prescription today for medication, please take the medications exactly as prescribed.  - Please discuss the results of any tests completed in the Emergency Department with your regular doctor, including any abnormal tests or findings.     RETURN PRECAUTIONS:  Please return to the nearest emergency department immediately if you develop fevers, chest pain, shortness of breath, abdominal pain, weakness, worsening of your symptoms, new symptoms, or if you have any other concerns.      If you have any outstanding labs or imaging studies at the time of your discharge please call to obtain the final results, the phone number to contact us here in the emergency department is [phone].     If you were prescribed any narcotic or sedative medications here in the Emergency Department, please do not drive after taking these medications and do not mix them with alcohol.

## 2023-05-05 DIAGNOSIS — F17210 Nicotine dependence, cigarettes, uncomplicated: Secondary | ICD-10-CM | POA: Diagnosis not present

## 2023-05-05 DIAGNOSIS — I10 Essential (primary) hypertension: Secondary | ICD-10-CM | POA: Diagnosis not present

## 2023-05-05 DIAGNOSIS — F17290 Nicotine dependence, other tobacco product, uncomplicated: Secondary | ICD-10-CM | POA: Diagnosis not present

## 2023-05-05 DIAGNOSIS — L02414 Cutaneous abscess of left upper limb: Secondary | ICD-10-CM | POA: Diagnosis not present
